# Patient Record
Sex: Female | Born: 1960 | Race: White | Hispanic: No | Marital: Single | Smoking: Current every day smoker
Health system: Southern US, Community
[De-identification: ages and names within clinical notes are randomized; demographics above are authoritative.]

## PROBLEM LIST (undated history)

## (undated) HISTORY — PX: BREAST LUMPECTOMY: SHX2

---

## 2012-09-28 ENCOUNTER — Ambulatory Visit: Payer: Self-pay | Admitting: Family Medicine

## 2012-11-03 ENCOUNTER — Ambulatory Visit: Payer: Self-pay | Admitting: Surgery

## 2012-11-06 LAB — PATHOLOGY REPORT

## 2013-04-05 ENCOUNTER — Ambulatory Visit: Payer: Self-pay | Admitting: Surgery

## 2014-03-30 ENCOUNTER — Ambulatory Visit: Payer: Self-pay | Admitting: Family Medicine

## 2014-10-14 NOTE — Op Note (Signed)
PATIENT NAME:  Donna Knox, [name]:  [id number] DATE OF BIRTH:  07-Feb-1961  DATE OF PROCEDURE:  11/03/2012  PREOPERATIVE DIAGNOSIS: Right superior medial breast nodule.   POSTOPERATIVE DIAGNOSIS:  Right superior medial breast nodule.  PROCEDURE PERFORMED:  Excisional biopsy of right upper medial breast nodule, 4 x 4.5 x 1 cm.   SURGEON:  Shauntavia Brackin A. Marquise Wicke, MD   SPECIMEN:  Right superior medial breast nodule.   ANESTHESIA:  MAC local.   ESTIMATED BLOOD LOSS:  5 mL.  COMPLICATIONS: None.   INDICATION FOR SURGERY:  [name] is a pleasant, 54 year old female who I know well, who has a breast nodule in her right breast, which was palpable and which she has had for 1 to 2 years, but appeared to be growing. I recommended biopsy and possible excision, and she opted for excision of breast nodule.   DETAILS OF PROCEDURE:  Informed consent was obtained from [name]. She was brought to the Operating Room suite. She was laid supine on the operating table. LMA was placed. General anesthesia was administered. Her right breast was prepped and draped in standard surgical fashion. The nodule was palpated, and a curvilinear incision was made in her right upper quadrant just inferior to the nodule. This was deepened down to the subcu tissue. The nodule was then dissected out using a combination of blunt dissection and Bovie electrocautery. It was grasped, and was delivered from the surface and was slowly circumferentially excised. It appeared to be consistency for a lipoma. The nodule capsule was not entered. It was tagged laterally with a long suture and superficially with a short suture. The specimen was handed off. The wound was irrigated with sterile water, and was noted to be hemostatic. The wound was then closed in 2 layers with an inner deep dermal inverted 3-0 Vicryl and a running 4-0 Monocryl subcuticular. A sterile dressing was then placed over the wound. The patient was then awoken, LMA was removed,  and she was brought to the postanesthesia care unit. There were no immediate complications. Needle, sponge and instrument count was correct at the end of the procedure.    ____________________________ Si Raiderhristopher A. Marjan Rosman, MD cal:mr D: 11/03/2012 20:12:45 ET T: 11/03/2012 20:28:35 ET JOB#: [id number]  cc: Cristal Deerhristopher A. Marg Macmaster, MD, <Dictator> Jarvis NewcomerHRISTOPHER A Lauris Keepers MD ELECTRONICALLY SIGNED 11/04/2012 15:38

## 2015-01-14 IMAGING — US ULTRASOUND RIGHT BREAST
1 series · 12 of 12 positions shown · non-contrast
Comparison: none

REASON FOR EXAM: RT BRST NODULE 2 OCLOCK BASELINE
COMMENTS:

[Series 1: ultrasound right breast · 0.08mm/px · 12 of 12 slices shown]
[im 1/12]
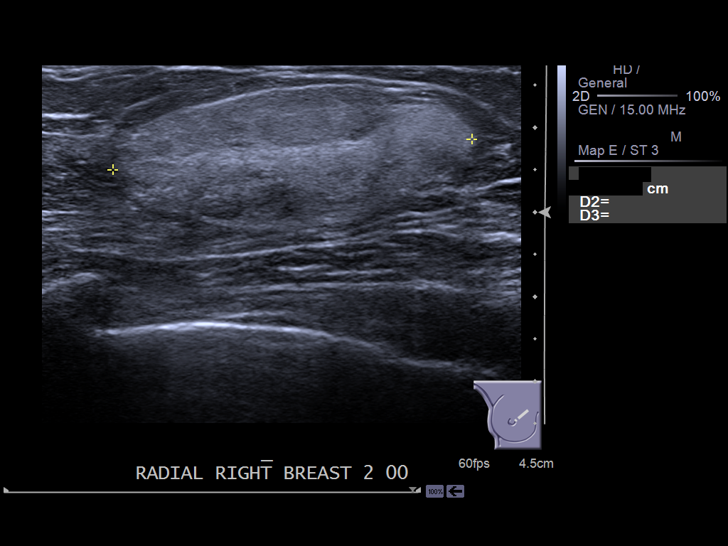
[im 2/12]
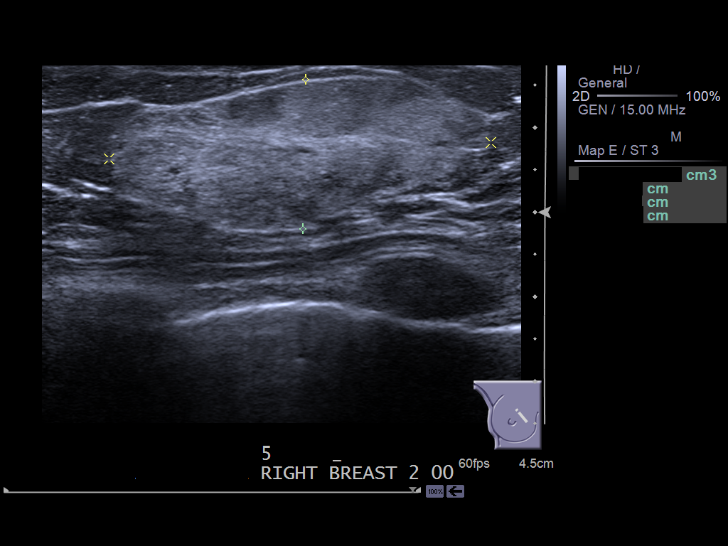
[im 3/12]
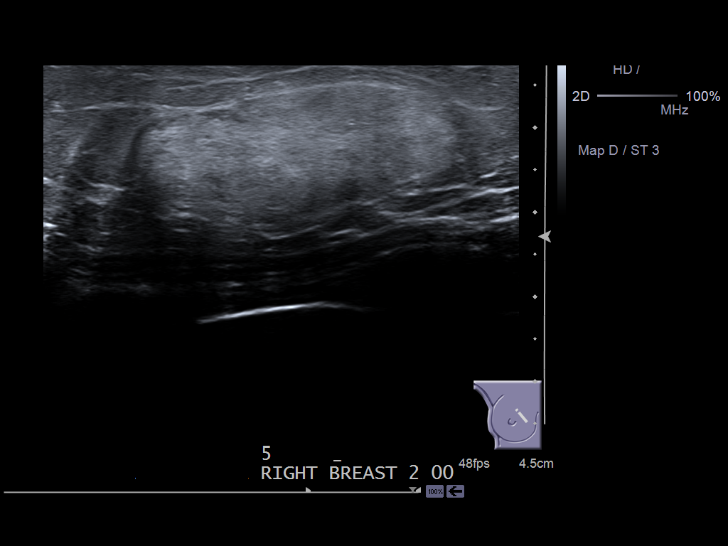
[im 4/12]
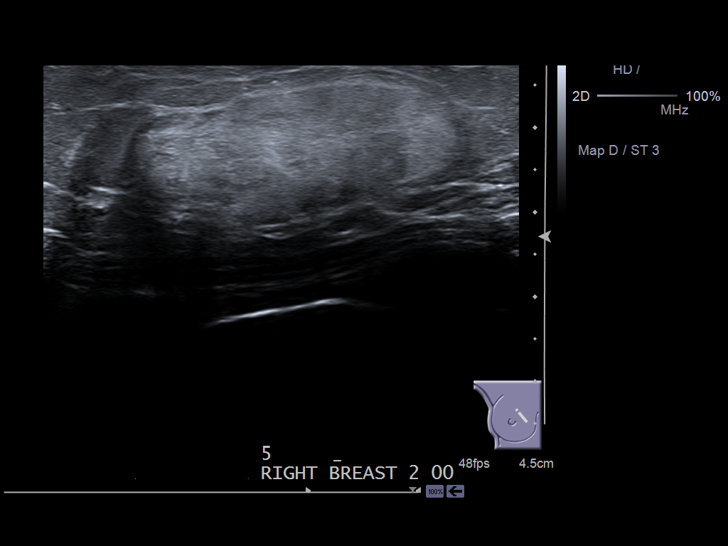
[im 5/12]
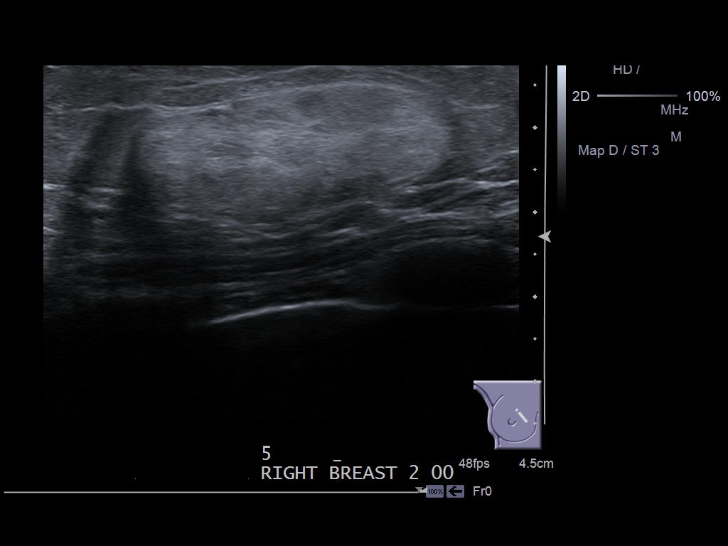
[im 6/12]
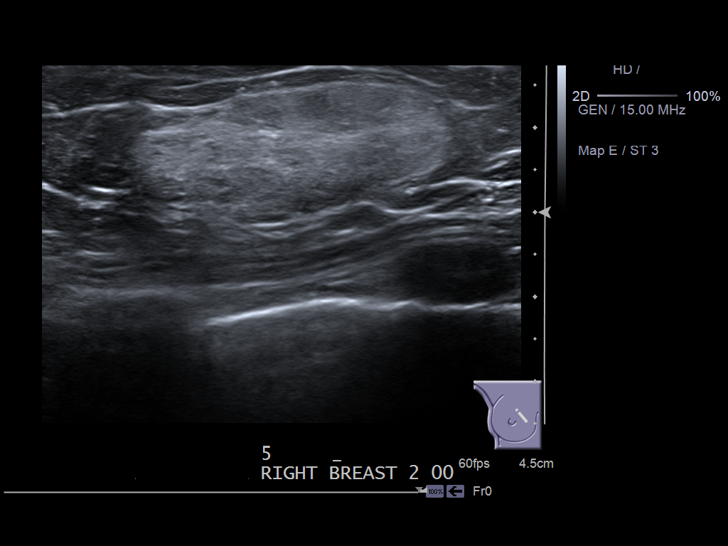
[im 7/12]
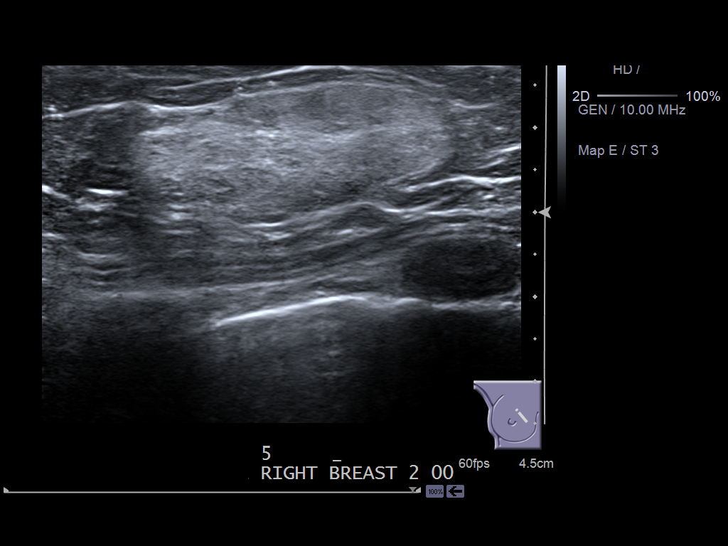
[im 8/12]
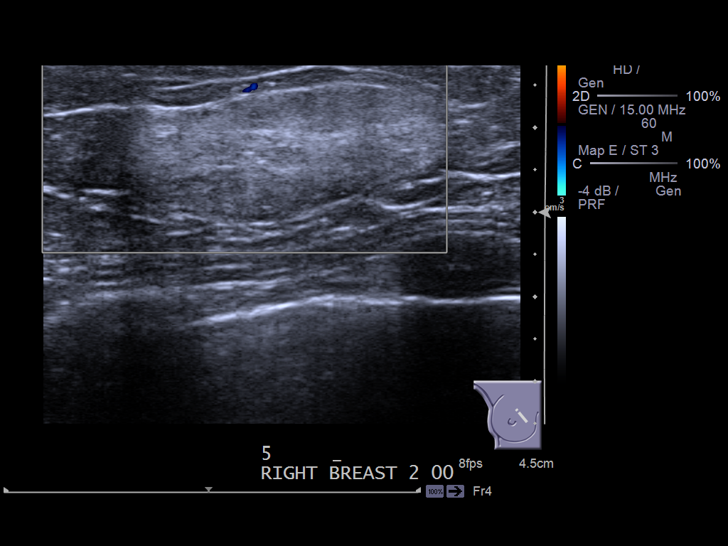
[im 9/12]
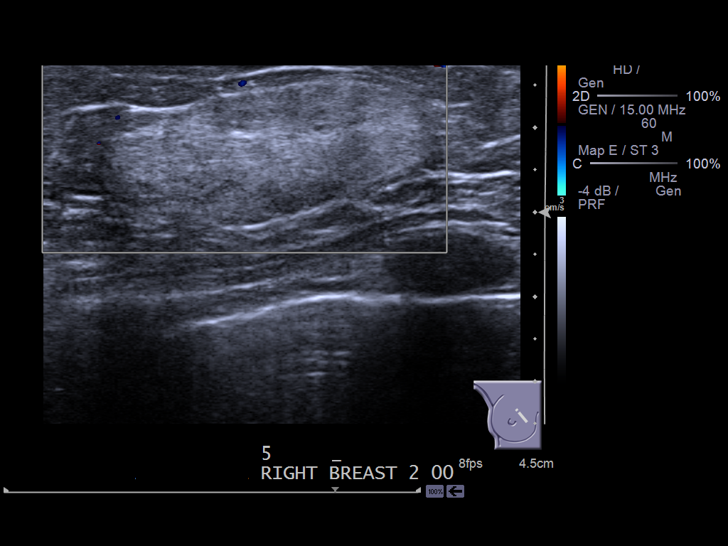
[im 10/12]
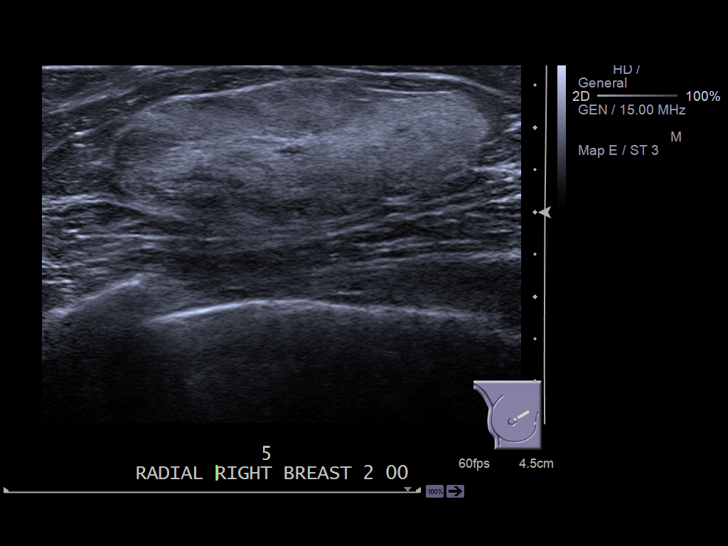
[im 11/12]
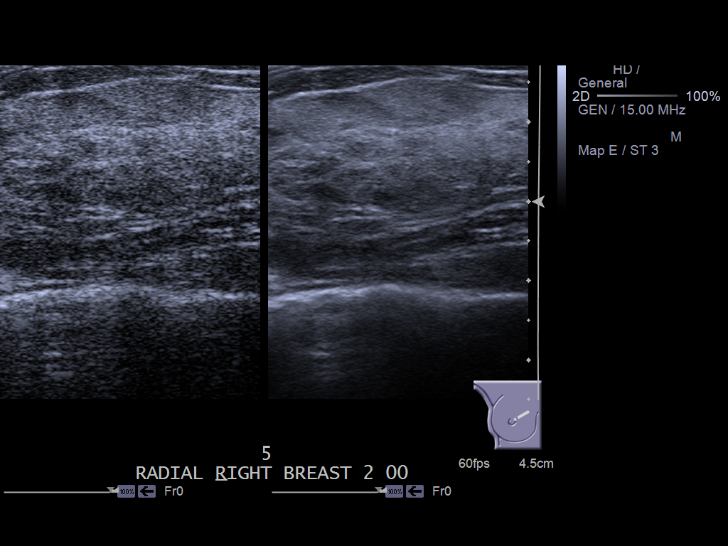
[im 12/12]
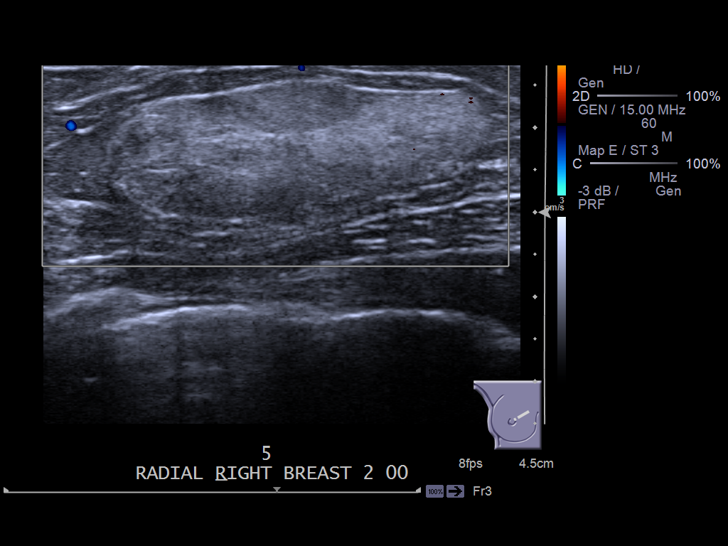

[12 of 12 positions shown; findings below may reference images not displayed]

PROCEDURE:     US  - US BREAST RIGHT  - September 28, 2012  [DATE]

RESULT:     The patient underwent ultrasound of the upper inner aspect of
the right breast at the site of the palpable nodule.

At the [DATE] position approximately 4 cm from the nipple there is an ovoid
hyperechoic fairly homogeneous appearing focus without distal shadowing. It
measures 4.3 x 4.5 x 1.8 cm. No significant cystic component is
demonstrated. No abnormal shadowing is demonstrated.
IMPRESSION: There is a hypoechoic focus in the upper inner aspect of
the right breast corresponding to the palpable findings into the
mammographic findings. Please see the dictation of the diagnostic mammogram
of today's date for final recommendations and BI-RADS classification.

Dictation site:1

## 2015-08-30 ENCOUNTER — Ambulatory Visit
Admission: EM | Admit: 2015-08-30 | Discharge: 2015-08-30 | Disposition: A | Discharge status: Home / Self Care | Payer: 59 | Attending: Family Medicine | Admitting: Family Medicine

## 2015-08-30 ENCOUNTER — Encounter: Payer: Self-pay | Admitting: *Deleted

## 2015-08-30 DIAGNOSIS — B349 Viral infection, unspecified: Secondary | ICD-10-CM | POA: Diagnosis not present

## 2015-08-30 LAB — RAPID INFLUENZA A&B ANTIGENS
Influenza A (ARMC): NEGATIVE
Influenza B (ARMC): NEGATIVE

## 2015-08-30 LAB — RAPID STREP SCREEN (MED CTR MEBANE ONLY): STREPTOCOCCUS, GROUP A SCREEN (DIRECT): NEGATIVE

## 2015-08-30 MED ORDER — AZITHROMYCIN 250 MG PO TABS: ORAL_TABLET | ORAL | Status: AC

## 2015-08-30 NOTE — ED Provider Notes (Signed)
CSN: [id number]     Arrival date & time 08/30/15  [id number] History   First MD Initiated Contact with Patient 08/30/15 1034     Chief Complaint  Patient presents with  . Sore Throat  . Chills  . Diarrhea  . Cough  . Fever   (Consider location/radiation/quality/duration/timing/severity/associated sxs/prior Treatment) HPI: Patient presents today with symptoms of sore throat, chills, diarrhea, cough, fever for the last few days. Patient has not had a fever since yesterday. She is not taking any medications today. She denies any chest pain, shortness of breath, abdominal pain, severe headache, bloody diarrhea, vomiting. She has noticed some colored mucus with coughing. She is a smoker. She denies any history of COPD or asthma. She does work around food.  History reviewed. No pertinent past medical history. Past Surgical History  Procedure Laterality Date  . Breast lumpectomy Right    History reviewed. No pertinent family history. Social History  Substance Use Topics  . Smoking status: Current Every Day Smoker -- 0.50 packs/day    Types: Cigarettes  . Smokeless tobacco: None  . Alcohol Use: No   OB History    No data available     Review of Systems: Negative except mentioned above.  Allergies  Review of patient's allergies indicates no known allergies.  Home Medications   Prior to Admission medications   Not on File   Meds Ordered and Administered this Visit  Medications - No data to display  BP 102/69 mmHg  Pulse 74  Temp(Src) 98.1 F (36.7 C) (Oral)  Resp 16  Ht 5\' 4"  (1.626 m)  Wt 127 lb (57.607 kg)  BMI 21.79 kg/m2  SpO2 96% No data found.   Physical Exam   GENERAL: NAD HEENT: mild pharyngeal erythema, no exudate, no erythema of TMs, no cervical LAD RESP: CTA B CARD: RRR ABD: +BS, NT, no rebound or guarding  NEURO: CN II-XII grossly intact   ED Course  Procedures (including critical care time)  Labs Review Labs Reviewed  RAPID INFLUENZA A&B ANTIGENS  ([facility] ONLY)  RAPID STREP SCREEN ([facility])  CULTURE, GROUP A STREP [facility])    Imaging Review No results found.    MDM  A/P: Viral Illness- encourage patient to rest, hydrate, Tylenol when necessary, cough suppressant when necessary, smoking cessation encouraged, would recommend patient try BRAT diet for diarrhea. She can start Z-Pak if her colored mucus becomes persistent in a few days. She should not return to work until her diarrhea has resolved and she has no fever. Work excuse was given for 2 days.    Jolene ProvostKirtida Laqueena Hinchey, MD 08/30/15 1114

## 2015-08-30 NOTE — ED Notes (Signed)
Sore throat, chills, fever, diarrhea, productive cough- green, since Sunday.

## 2015-08-30 NOTE — Discharge Instructions (Signed)
Take antibiotic only after a few days if no better and having colored mucus.

## 2015-09-01 LAB — CULTURE, GROUP A STREP (THRC)
